# Patient Record
Sex: Female | Born: 1995 | Race: Asian | Hispanic: No | Marital: Married | Smoking: Never smoker
Health system: Southern US, Community
[De-identification: ages and names within clinical notes are randomized; demographics above are authoritative.]

---

## 2019-12-09 ENCOUNTER — Inpatient Hospital Stay (HOSPITAL_COMMUNITY)
Admission: AD | Admit: 2019-12-09 | Discharge: 2019-12-10 | Disposition: A | Discharge status: Home / Self Care | Payer: Medicaid Other | Attending: Obstetrics and Gynecology | Admitting: Obstetrics and Gynecology

## 2019-12-09 ENCOUNTER — Other Ambulatory Visit: Payer: Self-pay

## 2019-12-09 ENCOUNTER — Encounter (HOSPITAL_COMMUNITY): Payer: Self-pay | Admitting: Obstetrics and Gynecology

## 2019-12-09 ENCOUNTER — Inpatient Hospital Stay (HOSPITAL_COMMUNITY): Payer: Medicaid Other

## 2019-12-09 DIAGNOSIS — Z3A10 10 weeks gestation of pregnancy: Secondary | ICD-10-CM | POA: Diagnosis not present

## 2019-12-09 DIAGNOSIS — O021 Missed abortion: Secondary | ICD-10-CM

## 2019-12-09 DIAGNOSIS — R102 Pelvic and perineal pain: Secondary | ICD-10-CM | POA: Diagnosis not present

## 2019-12-09 DIAGNOSIS — O26891 Other specified pregnancy related conditions, first trimester: Secondary | ICD-10-CM | POA: Diagnosis not present

## 2019-12-09 DIAGNOSIS — Z3A01 Less than 8 weeks gestation of pregnancy: Secondary | ICD-10-CM | POA: Diagnosis not present

## 2019-12-09 DIAGNOSIS — O209 Hemorrhage in early pregnancy, unspecified: Secondary | ICD-10-CM | POA: Diagnosis not present

## 2019-12-09 LAB — CBC
HCT: 39.1 % (ref 36.0–46.0)
Hemoglobin: 12.7 g/dL (ref 12.0–15.0)
MCH: 28.1 pg (ref 26.0–34.0)
MCHC: 32.5 g/dL (ref 30.0–36.0)
MCV: 86.5 fL (ref 80.0–100.0)
Platelets: 220 10*3/uL (ref 150–400)
RBC: 4.52 MIL/uL (ref 3.87–5.11)
RDW: 12.1 % (ref 11.5–15.5)
WBC: 5.5 10*3/uL (ref 4.0–10.5)
nRBC: 0 % (ref 0.0–0.2)

## 2019-12-09 LAB — URINALYSIS, ROUTINE W REFLEX MICROSCOPIC
Bacteria, UA: NONE SEEN
Bilirubin Urine: NEGATIVE
Glucose, UA: NEGATIVE mg/dL
Ketones, ur: NEGATIVE mg/dL
Leukocytes,Ua: NEGATIVE
Nitrite: NEGATIVE
Protein, ur: NEGATIVE mg/dL
Specific Gravity, Urine: 1.019 (ref 1.005–1.030)
pH: 5 (ref 5.0–8.0)

## 2019-12-09 LAB — HCG, QUANTITATIVE, PREGNANCY: hCG, Beta Chain, Quant, S: 19818 m[IU]/mL — ABNORMAL HIGH (ref <5)

## 2019-12-09 LAB — ABO/RH: ABO/RH(D): B NEG

## 2019-12-09 MED ORDER — RHO D IMMUNE GLOBULIN 1500 UNIT/2ML IJ SOSY
300.0000 ug | PREFILLED_SYRINGE | Freq: Once | INTRAMUSCULAR | Status: AC
  Administered 2019-12-09: 300 ug via INTRAMUSCULAR
  Filled 2019-12-09: qty 2

## 2019-12-09 MED ORDER — IBUPROFEN 600 MG PO TABS
600.0000 mg | ORAL_TABLET | Freq: Four times a day (QID) | ORAL | 1 refills | Status: DC | PRN
Start: 2019-12-09 — End: 2020-04-28

## 2019-12-09 MED ORDER — ACETAMINOPHEN 325 MG PO TABS: 650.0000 mg | ORAL_TABLET | Freq: Once | ORAL | Status: DC

## 2019-12-09 NOTE — Discharge Instructions (Signed)
Miscarriage A miscarriage is the loss of an unborn baby (fetus) before the 20th week of pregnancy. Follow these instructions at home: Medicines   Take over-the-counter and prescription medicines only as told by your doctor.  If you were prescribed antibiotic medicine, take it as told by your doctor. Do not stop taking the antibiotic even if you start to feel better.  Do not take NSAIDs unless your doctor says that this is safe for you. NSAIDs include aspirin and ibuprofen. These medicines can cause bleeding. Activity  Rest as directed. Ask your doctor what activities are safe for you.  Have someone help you at home during this time. General instructions  Write down how many pads you use each day and how soaked they are.  Watch the amount of tissue or clumps of blood (blood clots) that you pass from your vagina. Save any large amounts of tissue for your doctor.  Do not use tampons, douche, or have sex until your doctor approves.  To help you and your partner with the process of grieving, talk with your doctor or seek counseling.  When you are ready, meet with your doctor to talk about steps you should take for your health. Also, talk with your doctor about steps to take to have a healthy pregnancy in the future.  Keep all follow-up visits as told by your doctor. This is important. Contact a doctor if:  You have a fever or chills.  You have vaginal discharge that smells bad.  You have more bleeding. Get help right away if:  You have very bad cramps or pain in your back or belly.  You pass clumps of blood that are walnut-sized or larger from your vagina.  You pass tissue that is walnut-sized or larger from your vagina.  You soak more than 1 regular pad in an hour.  You get light-headed or weak.  You faint (pass out).  You have feelings of sadness that do not go away, or you have thoughts of hurting yourself. Summary  A miscarriage is the loss of an unborn baby before  the 20th week of pregnancy.  Follow your doctor's instructions for home care. Keep all follow-up appointments.  To help you and your partner with the process of grieving, talk with your doctor or seek counseling. This information is not intended to replace advice given to you by your health care provider. Make sure you discuss any questions you have with your health care provider. Document Revised: 10/11/2018 Document Reviewed: 07/25/2016 Elsevier Patient Education  2020 Elsevier Inc.  

## 2019-12-09 NOTE — MAU Provider Note (Addendum)
Chief Complaint: Vaginal Bleeding   First Provider Initiated Contact with Patient 12/09/19 2254        SUBJECTIVE HPI: Molly Blackwell is a 24 y.o. G2P1001 at [redacted]w[redacted]d by LMP who presents to maternity admissions reporting light pink bleeding and some mild pelvic cramping. . She denies vaginal itching/burning, urinary symptoms, h/a, dizziness, n/v, or fever/chills.    Vaginal Bleeding The patient's primary symptoms include pelvic pain and vaginal bleeding (pink). The patient's pertinent negatives include no genital itching, genital lesions or genital odor. This is a new problem. The current episode started in the past 7 days. The problem occurs intermittently. The problem has been unchanged. The pain is mild. The problem affects both sides. She is pregnant. Associated symptoms include abdominal pain. Pertinent negatives include no back pain, constipation, diarrhea, dysuria, fever, headaches, nausea or vomiting. The vaginal discharge was bloody (pink). The vaginal bleeding is spotting. She has not been passing clots. She has not been passing tissue. Nothing aggravates the symptoms. She has tried nothing for the symptoms.   RN Note: PT has had light pink when since Friday. Thought maybe it was from constipation. Since  Saturday it has been red to dk brown and still light. Has some mild period-like cramping.  Has positive pregnancy test from [place].    No past medical history on file.  Social History   Socioeconomic History  . Marital status: Married    Spouse name: Not on file  . Number of children: Not on file  . Years of education: Not on file  . Highest education level: Not on file  Occupational History  . Not on file  Tobacco Use  . Smoking status: Not on file  Substance and Sexual Activity  . Alcohol use: Not on file  . Drug use: Not on file  . Sexual activity: Not on file  Other Topics Concern  . Not on file  Social History Narrative  . Not on file   Social  Determinants of Health   Financial Resource Strain:   . Difficulty of Paying Living Expenses:   Food Insecurity:   . Worried About Charity fundraiser in the Last Year:   . Arboriculturist in the Last Year:   Transportation Needs:   . Film/video editor (Medical):   Marland Kitchen Lack of Transportation (Non-Medical):   Physical Activity:   . Days of Exercise per Week:   . Minutes of Exercise per Session:   Stress:   . Feeling of Stress :   Social Connections:   . Frequency of Communication with Friends and Family:   . Frequency of Social Gatherings with Friends and Family:   . Attends Religious Services:   . Active Member of Clubs or Organizations:   . Attends Archivist Meetings:   Marland Kitchen Marital Status:   Intimate Partner Violence:   . Fear of Current or Ex-Partner:   . Emotionally Abused:   Marland Kitchen Physically Abused:   . Sexually Abused:    No current facility-administered medications on file prior to encounter.   No current outpatient medications on file prior to encounter.   Not on File  I have reviewed patient's Past Medical Hx, Surgical Hx, Family Hx, Social Hx, medications and allergies.   ROS:  Review of Systems  Constitutional: Negative for fever.  Gastrointestinal: Positive for abdominal pain. Negative for constipation, diarrhea, nausea and vomiting.  Genitourinary: Positive for pelvic pain and vaginal bleeding. Negative for dysuria.  Musculoskeletal: Negative for  back pain.  Neurological: Negative for headaches.   Review of Systems  Other systems negative   Physical Exam  Physical Exam Patient Vitals for the past 24 hrs:  BP Temp Temp src Pulse Resp SpO2 Height Weight  12/09/19 1513 (!) 101/58 99 F (37.2 C) Oral 79 16 100 % 5\' 1"  (1.549 m) 44.7 kg   Constitutional: Well-developed, well-nourished female in no acute distress.  Cardiovascular: normal rate Respiratory: normal effort GI: Abd soft, non-tender. Pos BS x 4 MS: Extremities nontender, no edema,  normal ROM Neurologic: Alert and oriented x 4.  GU: Neg CVAT.  PELVIC EXAM: very light discharge, no red bleeding.  Pelvis nontender, nondistended.  Full pelvic exam deferred since we are getting an tonight.   LAB RESULTS Results for orders placed or performed during the hospital encounter of 12/09/19 (from the past 24 hour(s))  CBC     Status: None   Collection Time: 12/09/19  3:31 PM  Result Value Ref Range   WBC 5.5 4.0 - 10.5 K/uL   RBC 4.52 3.87 - 5.11 MIL/uL   Hemoglobin 12.7 12.0 - 15.0 g/dL   HCT 02/08/20 62.7 - 03.5 %   MCV 86.5 80.0 - 100.0 fL   MCH 28.1 26.0 - 34.0 pg   MCHC 32.5 30.0 - 36.0 g/dL   RDW 00.9 38.1 - 82.9 %   Platelets 220 150 - 400 K/uL   nRBC 0.0 0.0 - 0.2 %  hCG, quantitative, pregnancy     Status: Abnormal   Collection Time: 12/09/19  3:31 PM  Result Value Ref Range   hCG, Beta Chain, Quant, S 19,818 (H) <5 mIU/mL  ABO/Rh     Status: None   Collection Time: 12/09/19  3:31 PM  Result Value Ref Range   ABO/RH(D) B NEG   Rh IG workup (includes ABO/Rh)     Status: None (Preliminary result)   Collection Time: 12/09/19  3:31 PM  Result Value Ref Range   Gestational Age(Wks) 10    ABO/RH(D) B NEG    Antibody Screen      NEG Performed at [facility], [address] [place], [place]    Unit Number [id number]    Blood Component Type RHIG    Unit division 00    Status of Unit ALLOCATED    Transfusion Status OK TO TRANSFUSE   Urinalysis, Routine w reflex microscopic     Status: Abnormal   Collection Time: 12/09/19  6:20 PM  Result Value Ref Range   Color, Urine YELLOW YELLOW   APPearance HAZY (A) CLEAR   Specific Gravity, Urine 1.019 1.005 - 1.030   pH 5.0 5.0 - 8.0   Glucose, UA NEGATIVE NEGATIVE mg/dL   Hgb urine dipstick LARGE (A) NEGATIVE   Bilirubin Urine NEGATIVE NEGATIVE   Ketones, ur NEGATIVE NEGATIVE mg/dL   Protein, ur NEGATIVE NEGATIVE mg/dL   Nitrite NEGATIVE NEGATIVE   Leukocytes,Ua NEGATIVE NEGATIVE   RBC / HPF  21-50 0 - 5 RBC/hpf   WBC, UA 6-10 0 - 5 WBC/hpf   Bacteria, UA NONE SEEN NONE SEEN   Squamous Epithelial / LPF 0-5 0 - 5   Mucus PRESENT     --/--/B NEG, B NEG (06/08 1531)  IMAGING 02-05-1989 OB LESS THAN 14 WEEKS WITH OB TRANSVAGINAL  Result Date: 12/09/2019 CLINICAL DATA:  Vaginal bleeding, quantitative beta HCG 19,818 EXAM: OBSTETRIC <14 WK ULTRASOUND TECHNIQUE: Transabdominal ultrasound was performed for evaluation of the gestation as well as  the maternal uterus and adnexal regions. COMPARISON:  None. FINDINGS: Intrauterine gestational sac: Single Yolk sac:  Visualized. Embryo:  Visualized. Cardiac Activity: Not Visualized on cine loop imaging. M-mode was not performed by the sonographer. CRL:   10.6 mm   7 w 1 d                  [place] EDC: 07/26/2020 Subchorionic hemorrhage:  None visualized. Maternal uterus/adnexae: Right ovary measures 3.3 x 2.6 x 2.6 cm and the left ovary measures 3.2 x 2.5 x 1.9 cm. No pelvic mass or free fluid. IMPRESSION: 1. Single intrauterine pregnancy as above estimated age 80 weeks and 1 day based on crown-rump length measurement. Fetal cardiac activity was not detected on cine loop imaging, compatible with fetal demise. Electronically Signed   By: Sharlet Salina M.D.   On: 12/09/2019 18:58    MAU Management/MDM: Ordered usual first trimester r/o ectopic labs.   Pelvic exam and cultures done Will check baseline Ultrasound to rule out ectopic.  >> 7 w1d Single IUP seen without heartbeat  This bleeding/pain can represent a normal pregnancy with bleeding, spontaneous abortion or even an ectopic which can be life-threatening.  The process as listed above helps to determine which of these is present.  Discussed findings with patient Discussed options of expectant management, cytotec and D&C They desire to proceed expectantly Rhophylac given due to Rh neg status Will set up followup in office  ASSESSMENT 1. Vaginal bleeding in pregnancy, first trimester   2    Cramping in  early pregnancy 3.    Single intrauterine pregnancy at [redacted]w[redacted]d 4.    [redacted]w[redacted]d size fetus noted without cardiac activity (missed abortion)  PLAN Discharge home Discussed expected course of SAB  Warning signs reviewed Rx Ibuprofen for cramping Message sent to office to arrange followup Pt stable at time of discharge. Encouraged to return here or to other Urgent Care/ED if she develops worsening of symptoms, increase in pain, fever, or other concerning symptoms.    Wynelle Bourgeois CNM, MSN Certified Nurse-Midwife 12/09/2019  10:54 PM

## 2019-12-09 NOTE — MAU Note (Addendum)
PT has had light pink when since Friday. Thought maybe it was from constipation. Since  Saturday it has been red to dk brown and still light. Has some mild period-like cramping.  Has positive pregnancy test from [facility].

## 2019-12-10 DIAGNOSIS — O021 Missed abortion: Secondary | ICD-10-CM

## 2019-12-10 DIAGNOSIS — Z3A01 Less than 8 weeks gestation of pregnancy: Secondary | ICD-10-CM

## 2019-12-10 LAB — RH IG WORKUP (INCLUDES ABO/RH)
ABO/RH(D): B NEG
Antibody Screen: NEGATIVE
Gestational Age(Wks): 10
Unit division: 0

## 2019-12-15 ENCOUNTER — Encounter: Payer: Self-pay | Admitting: Certified Nurse Midwife

## 2019-12-15 ENCOUNTER — Ambulatory Visit (INDEPENDENT_AMBULATORY_CARE_PROVIDER_SITE_OTHER): Payer: Medicaid Other | Admitting: Certified Nurse Midwife

## 2019-12-15 ENCOUNTER — Other Ambulatory Visit: Payer: Self-pay

## 2019-12-15 VITALS — BP 97/54 | HR 76 | Wt 100.1 lb

## 2019-12-15 DIAGNOSIS — K5900 Constipation, unspecified: Secondary | ICD-10-CM

## 2019-12-15 DIAGNOSIS — O021 Missed abortion: Secondary | ICD-10-CM | POA: Diagnosis not present

## 2019-12-15 MED ORDER — POLYETHYLENE GLYCOL 3350 17 G PO PACK: 17.0000 g | PACK | Freq: Every day | ORAL | 0 refills | Status: DC

## 2019-12-15 MED ORDER — DOCUSATE SODIUM 100 MG PO CAPS: 100.0000 mg | ORAL_CAPSULE | Freq: Two times a day (BID) | ORAL | 2 refills | Status: DC | PRN

## 2019-12-15 NOTE — Progress Notes (Signed)
History:  Ms. Molly Blackwell is a 24 y.o. G2P1001 who presents to clinic today for missed AB follow up. Patient was diagnosed with missed AB on 12/09/19. She chose expectant management at that time. Patient reports that over the last week she has been having lower abdominal cramping like "period cramps". She denies abdominal pain currently. Patient reports continued vaginal bleeding. Describes the vaginal bleeding as bright red spotting without clots. She reports passing one quarter sized clot earlier this week, but was unsure if any tissue has actually passed.   Patient reports hx of constipation prior to pregnancy. Patient reports last BM was 1 week ago and request medication for help with constipation.   The following portions of the patient's history were reviewed and updated as appropriate: allergies, current medications, family history, past medical history, social history, past surgical history and problem list.  Review of Systems:  Review of Systems  Constitutional: Negative.   Respiratory: Negative.   Cardiovascular: Negative.   Gastrointestinal: Positive for abdominal pain and constipation. Negative for nausea and vomiting.  Genitourinary:       Vaginal bleeding  Neurological: Negative.   Psychiatric/Behavioral: Negative.      Objective:  Physical Exam BP (!) 97/54   Pulse 76   Wt 100 lb 1.6 oz (45.4 kg)   LMP 09/27/2019 (Exact Date)   Breastfeeding Yes   BMI 18.91 kg/m  Physical Exam Vitals and nursing note reviewed.  Cardiovascular:     Rate and Rhythm: Normal rate and regular rhythm.  Pulmonary:     Effort: Pulmonary effort is normal. No respiratory distress.     Breath sounds: Normal breath sounds. No wheezing.  Abdominal:     General: Bowel sounds are increased.     Palpations: Abdomen is soft.     Tenderness: There is abdominal tenderness in the right lower quadrant, periumbilical area and left lower quadrant. There is no right CVA tenderness or left CVA tenderness.   Skin:    General: Skin is warm and dry.  Neurological:     Mental Status: She is alert and oriented to person, place, and time.  Psychiatric:        Mood and Affect: Mood normal.        Behavior: Behavior normal.        Thought Content: Thought content normal.     Assessment & Plan:  1. Missed abortion - Follow up ultrasound ordered to assess for need of medical management or D&C  - Educated and discussed follow up with patient and what occurs if she needs medical management  - Answered patient's questions  - US OB Transvaginal; Future - Beta hCG quant (ref lab)  2. Constipation, unspecified constipation type - polyethylene glycol (MIRALAX / GLYCOLAX) 17 g packet; Take 17 g by mouth daily. Use daily until normal bowel movement. Once bowel movement complete then start taking Colace daily  Dispense: 5 each; Refill: 0 - docusate sodium (COLACE) 100 MG capsule; Take 1 capsule (100 mg total) by mouth 2 (two) times daily as needed.  Dispense: 30 capsule; Refill: 2   Sharyon Cable, CNM 12/15/2019 11:03 AM

## 2019-12-15 NOTE — Progress Notes (Signed)
States she is having intermittent abdominal pain, describes "like period pain." Pt reports last BM one week ago; has not tried anything for constipation. Constipation is an ongoing issue. Pain improves with ibuprofen. Reports vaginal bleeding, changing pad twice daily. States she would like to try for pregnancy again.   Fleet Contras RN  12/15/19

## 2019-12-15 NOTE — Patient Instructions (Signed)
Miscarriage A miscarriage is the loss of an unborn baby (fetus) before the 20th week of pregnancy. Follow these instructions at home: Medicines   Take over-the-counter and prescription medicines only as told by your doctor.  If you were prescribed antibiotic medicine, take it as told by your doctor. Do not stop taking the antibiotic even if you start to feel better.  Do not take NSAIDs unless your doctor says that this is safe for you. NSAIDs include aspirin and ibuprofen. These medicines can cause bleeding. Activity  Rest as directed. Ask your doctor what activities are safe for you.  Have someone help you at home during this time. General instructions  Write down how many pads you use each day and how soaked they are.  Watch the amount of tissue or clumps of blood (blood clots) that you pass from your vagina. Save any large amounts of tissue for your doctor.  Do not use tampons, douche, or have sex until your doctor approves.  To help you and your partner with the process of grieving, talk with your doctor or seek counseling.  When you are ready, meet with your doctor to talk about steps you should take for your health. Also, talk with your doctor about steps to take to have a healthy pregnancy in the future.  Keep all follow-up visits as told by your doctor. This is important. Contact a doctor if:  You have a fever or chills.  You have vaginal discharge that smells bad.  You have more bleeding. Get help right away if:  You have very bad cramps or pain in your back or belly.  You pass clumps of blood that are walnut-sized or larger from your vagina.  You pass tissue that is walnut-sized or larger from your vagina.  You soak more than 1 regular pad in an hour.  You get light-headed or weak.  You faint (pass out).  You have feelings of sadness that do not go away, or you have thoughts of hurting yourself. Summary  A miscarriage is the loss of an unborn baby before  the 20th week of pregnancy.  Follow your doctor's instructions for home care. Keep all follow-up appointments.  To help you and your partner with the process of grieving, talk with your doctor or seek counseling. This information is not intended to replace advice given to you by your health care provider. Make sure you discuss any questions you have with your health care provider. Document Revised: 10/11/2018 Document Reviewed: 07/25/2016 Elsevier Patient Education  2020 Elsevier Inc.  

## 2019-12-16 LAB — BETA HCG QUANT (REF LAB): hCG Quant: 619 m[IU]/mL

## 2019-12-22 ENCOUNTER — Other Ambulatory Visit: Payer: Self-pay

## 2019-12-22 ENCOUNTER — Ambulatory Visit
Admission: RE | Admit: 2019-12-22 | Discharge: 2019-12-22 | Disposition: A | Discharge status: Home / Self Care | Payer: Medicaid Other | Source: Ambulatory Visit | Attending: Certified Nurse Midwife | Admitting: Certified Nurse Midwife

## 2019-12-22 DIAGNOSIS — O021 Missed abortion: Secondary | ICD-10-CM

## 2019-12-23 MED ORDER — MISOPROSTOL 200 MCG PO TABS: ORAL_TABLET | ORAL | 0 refills | Status: DC

## 2019-12-23 NOTE — Addendum Note (Signed)
Addended by: Sharyon Cable on: 12/23/2019 05:00 PM   Modules accepted: Orders

## 2019-12-29 ENCOUNTER — Encounter: Payer: Self-pay | Admitting: Obstetrics and Gynecology

## 2019-12-29 ENCOUNTER — Ambulatory Visit (INDEPENDENT_AMBULATORY_CARE_PROVIDER_SITE_OTHER): Payer: Medicaid Other | Admitting: Obstetrics and Gynecology

## 2019-12-29 ENCOUNTER — Other Ambulatory Visit: Payer: Self-pay

## 2019-12-29 VITALS — BP 94/56 | HR 77 | Ht 61.0 in | Wt 97.0 lb

## 2019-12-29 DIAGNOSIS — O021 Missed abortion: Secondary | ICD-10-CM

## 2019-12-29 NOTE — Progress Notes (Signed)
Subjective:  Molly Blackwell is a 23 y.o. G2P1001 at [redacted]w[redacted]d who presents today for FU BHCG. She was seen on 6/8,  Results from that day show IUP on [place] measuring 7 weeks without cardiac activity. The patient chose expectant management. She followed up in the office on 6/14. On 6/22 she was given cytotec. Quant on 6/14 was 619.  She denies pain or vaginal bleeding.  Objective:  Physical Exam  Nursing note and vitals reviewed. Constitutional: She is oriented to person, place, and time. She appears well-developed and well-nourished. No distress.  HENT:  Head: Normocephalic.  Cardiovascular: Normal rate.  Respiratory: Effort normal.  GI: Soft. There is no tenderness.  Neurological: She is alert and oriented to person, place, and time. Skin: Skin is warm and dry.  Psychiatric: She has a normal mood and affect.   No results found for this or any previous visit (from the past 24 hour(s)).  Assessment/Plan:  SAB follow up Quant today If quant is still positive, will repeat weekly until quant reaches 0.  Duane Lope, NP 12/30/2019 9:23 PM

## 2019-12-30 LAB — BETA HCG QUANT (REF LAB): hCG Quant: 8 m[IU]/mL

## 2019-12-31 ENCOUNTER — Ambulatory Visit: Payer: Medicaid Other | Admitting: Family Medicine

## 2019-12-31 ENCOUNTER — Telehealth: Payer: Self-pay | Admitting: Lactation Services

## 2019-12-31 DIAGNOSIS — O021 Missed abortion: Secondary | ICD-10-CM

## 2019-12-31 NOTE — Telephone Encounter (Signed)
Called patient to inform her of Beta Hcg results and need for non stat beta next week.   Was not able to reach patient. Message left for patient to call the office for results.

## 2019-12-31 NOTE — Telephone Encounter (Signed)
-----   Message from Duane Lope, NP sent at 12/31/2019  7:20 AM EDT ----- Please call Ms. Tsukamoto and let her know she will need a Quant next week. Non stat. She does not need to see a provider.   Thank you,  Victorino Dike

## 2020-01-01 ENCOUNTER — Ambulatory Visit: Payer: Medicaid Other | Admitting: Obstetrics & Gynecology

## 2020-01-01 NOTE — Addendum Note (Signed)
Addended by: Ed Blalock on: 01/01/2020 12:22 PM   Modules accepted: Orders

## 2020-01-01 NOTE — Telephone Encounter (Signed)
Attempted to call patient again with Hcg results. Patient did not answer.   My Chart message sent.

## 2020-01-27 ENCOUNTER — Other Ambulatory Visit: Payer: Self-pay | Admitting: General Practice

## 2020-01-27 ENCOUNTER — Other Ambulatory Visit: Payer: Medicaid Other

## 2020-01-27 ENCOUNTER — Other Ambulatory Visit: Payer: Self-pay

## 2020-01-27 DIAGNOSIS — O021 Missed abortion: Secondary | ICD-10-CM

## 2020-01-28 LAB — BETA HCG QUANT (REF LAB): hCG Quant: <1 m[IU]/mL

## 2020-04-28 ENCOUNTER — Other Ambulatory Visit: Payer: Self-pay

## 2020-04-28 ENCOUNTER — Emergency Department (HOSPITAL_BASED_OUTPATIENT_CLINIC_OR_DEPARTMENT_OTHER)
Admission: EM | Admit: 2020-04-28 | Discharge: 2020-04-28 | Disposition: A | Discharge status: Home / Self Care | Payer: Medicaid Other | Attending: Emergency Medicine | Admitting: Emergency Medicine

## 2020-04-28 ENCOUNTER — Emergency Department (HOSPITAL_BASED_OUTPATIENT_CLINIC_OR_DEPARTMENT_OTHER): Payer: Medicaid Other

## 2020-04-28 ENCOUNTER — Encounter (HOSPITAL_BASED_OUTPATIENT_CLINIC_OR_DEPARTMENT_OTHER): Payer: Self-pay

## 2020-04-28 DIAGNOSIS — O99512 Diseases of the respiratory system complicating pregnancy, second trimester: Secondary | ICD-10-CM | POA: Insufficient documentation

## 2020-04-28 DIAGNOSIS — J069 Acute upper respiratory infection, unspecified: Secondary | ICD-10-CM | POA: Diagnosis not present

## 2020-04-28 DIAGNOSIS — R000 Tachycardia, unspecified: Secondary | ICD-10-CM | POA: Diagnosis not present

## 2020-04-28 DIAGNOSIS — Z20822 Contact with and (suspected) exposure to covid-19: Secondary | ICD-10-CM | POA: Insufficient documentation

## 2020-04-28 DIAGNOSIS — Z3A15 15 weeks gestation of pregnancy: Secondary | ICD-10-CM | POA: Insufficient documentation

## 2020-04-28 DIAGNOSIS — O26892 Other specified pregnancy related conditions, second trimester: Secondary | ICD-10-CM | POA: Diagnosis present

## 2020-04-28 DIAGNOSIS — R059 Cough, unspecified: Secondary | ICD-10-CM

## 2020-04-28 LAB — RESPIRATORY PANEL BY RT PCR (FLU A&B, COVID)
Influenza A by PCR: NEGATIVE
Influenza B by PCR: NEGATIVE
SARS Coronavirus 2 by RT PCR: NEGATIVE

## 2020-04-28 NOTE — Discharge Instructions (Signed)
Your COVID test and chest xray were both negative today. Your symptoms are likely related to a common cold virus.  I would recommend OTC medications for symptomatic relief including cough suppressant such as Mucinex, cough drops, vicks vaporub, and a humidifier at nighttime to help you sleep.  Follow up with your OBGYN regarding your ED visit today Return to the ED for any worsening symptoms

## 2020-04-28 NOTE — ED Notes (Signed)
SpO2 maintained at 97% after ambulation

## 2020-04-28 NOTE — ED Provider Notes (Signed)
[facility] EMERGENCY DEPARTMENT Provider Note   CSN: [id number] Arrival date & time: 04/28/20  1401     History Chief Complaint  Patient presents with  . Cough    Molly Blackwell is a 24 y.o. female who is currently 15 weeks 4 days pregnant ([id number]) who presents to the ED today with complaint of cough and chest congestion for the past 3 days. She has been taking OTC cough medication and Tylenol without relief prompting her to come to the ED today. She feels short of breath with the coughing spells but not at rest. No chest pain. Pt is not vaccinated against COVID 19. She denies any recent sick contacts. No fevers, chills, abdominal pain, vaginal discharge/bleeding, urinary complaints, headache, leg swelling, or any other associated symptoms.   The history is provided by the patient and medical records.       History reviewed. No pertinent past medical history.  There are no problems to display for this patient.   History reviewed. No pertinent surgical history.   OB History    Gravida  3   Para  1   Term  1   Preterm      AB      Living  1     SAB      TAB      Ectopic      Multiple      Live Births  1           Family History  Problem Relation Age of Onset  . Diabetes Mother   . Diabetes Father     Social History   Tobacco Use  . Smoking status: Never Smoker  . Smokeless tobacco: Never Used  Vaping Use  . Vaping Use: Never used  Substance Use Topics  . Alcohol use: Never  . Drug use: Never    Home Medications Prior to Admission medications   Medication Sig Start Date End Date Taking? Authorizing Provider  Prenatal Vit-Fe Fumarate-FA (M-NATAL PLUS) 27-1 MG TABS Take 1 tablet by mouth daily. 04/26/20  Yes [provider]    Allergies    Patient has no known allergies.  Review of Systems   Review of Systems  Constitutional: Negative for chills and fever.  Respiratory: Positive for cough.        + chest congestion    Cardiovascular: Negative for chest pain.  Gastrointestinal: Negative for abdominal pain.  Genitourinary: Negative for vaginal bleeding and vaginal discharge.  All other systems reviewed and are negative.   Physical Exam Updated Vital Signs BP 113/71 (BP Location: Left Arm)   Pulse (!) 111   Temp 98.4 F (36.9 C) (Oral)   Resp 20   Ht 5\' 2"  (1.575 m)   Wt 45.8 kg   LMP 09/27/2019 (Exact Date)   SpO2 93%   BMI 18.47 kg/m   Physical Exam Vitals and nursing note reviewed.  Constitutional:      Appearance: She is not ill-appearing or diaphoretic.  HENT:     Head: Normocephalic and atraumatic.  Eyes:     Conjunctiva/sclera: Conjunctivae normal.  Cardiovascular:     Rate and Rhythm: Regular rhythm. Tachycardia present.  Pulmonary:     Breath sounds: Transmitted upper airway sounds present. Examination of the right-lower field reveals rales. Rales present.     Comments: Able to speak in short sentences between coughing spells. Satting 93% on RA at rest.  Abdominal:     Palpations: Abdomen is soft.  Tenderness: There is no abdominal tenderness.  Musculoskeletal:     Cervical back: Neck supple.  Skin:    General: Skin is warm and dry.  Neurological:     Mental Status: She is alert.     ED Results / Procedures / Treatments   Labs (all labs ordered are listed, but only abnormal results are displayed) Labs Reviewed  RESPIRATORY PANEL BY RT PCR (FLU A&B, COVID)    EKG None  Radiology DG Chest Port 1 View  Result Date: 04/28/2020 CLINICAL DATA:  Fifteen weeks pregnant, flu like symptoms, cough EXAM: PORTABLE CHEST 1 VIEW COMPARISON:  None. FINDINGS: The heart size and mediastinal contours are within normal limits. Both lungs are clear. The visualized skeletal structures are unremarkable. IMPRESSION: No active disease. Electronically Signed   By: Judie Petit.  Shick M.D.   On: 04/28/2020 15:20    Procedures Procedures (including critical care time)  Medications Ordered in  ED Medications - No data to display  ED Course  I have reviewed the triage vital signs and the nursing notes.  Pertinent labs & imaging results that were available during my care of the patient were reviewed by me and considered in my medical decision making (see chart for details).  Clinical Course as of Apr 28 1532  Wed Apr 28, 2020  1518 SARS Coronavirus 2 by RT PCR: NEGATIVE [MV]    Clinical Course User Index [MV] Tanda Rockers, PA-C   MDM Rules/Calculators/A&P                          24 year old female presenting to the ED today with complaint of cough and chest congestion x 3 days. She is currently [redacted]w[redacted]d pregnant and is unvaccinated against COVID. On arrival to the ED pt is afebrile and nontachypneic however tachycardic in the low 110s. On exam she is actively coughing in the room. She is speaking in short sentences between coughing spells and satting 93% on RA at rest. Will plan to ambulate with pulse ox. With auscultation pt has rales throughout however worse in the right lower lung base. Will plan to check for COVID at this time and obtain a CXR with concern for pneumonia. Given she is 15 weeks she can be easily shielded.   Pt ambulated in the ED and O2 sats remained at 97%.  COVID negative CXR without signs of pneumonia today Will treat for URI with cough. Pt instructed on OTC remedies including mucinex, cough drops, humidifiers and close PCP/OBGYN follow up. She is in agreement with plan and stable for discharge home.   This note was prepared using Dragon voice recognition software and may include unintentional dictation errors due to the inherent limitations of voice recognition software.   Final Clinical Impression(s) / ED Diagnoses Final diagnoses:  Viral URI with cough    Rx / DC Orders ED Discharge Orders    None       Discharge Instructions     Your COVID test and chest xray were both negative today. Your symptoms are likely related to a common cold  virus.  I would recommend OTC medications for symptomatic relief including cough suppressant such as Mucinex, cough drops, vicks vaporub, and a humidifier at nighttime to help you sleep.  Follow up with your OBGYN regarding your ED visit today Return to the ED for any worsening symptoms       Tanda Rockers, PA-C 04/28/20 1533    Long, Arlyss Repress, MD  04/29/20 0710  

## 2020-04-28 NOTE — ED Triage Notes (Addendum)
Pt c/o flu like sx x 2 days-NAD-steady gait-pt reports she is [redacted] weeks pregnant

## 2020-12-27 ENCOUNTER — Other Ambulatory Visit: Payer: Self-pay | Admitting: Obstetrics and Gynecology

## 2020-12-27 DIAGNOSIS — O021 Missed abortion: Secondary | ICD-10-CM

## 2021-02-12 IMAGING — US US OB < 14 WEEKS - US OB TV
1 series · 15 of 28 positions shown · non-contrast
Comparison: None.

CLINICAL DATA: Vaginal bleeding, quantitative beta HCG 19,818

EXAM:
OBSTETRIC <14 WK ULTRASOUND
TECHNIQUE: Transabdominal ultrasound was performed for evaluation of the
gestation as well as the maternal uterus and adnexal regions.

[Series 1: us ob < 14 weeks - us ob tv · 57 acquisitions, 15 frames shown]
[im 1/57]
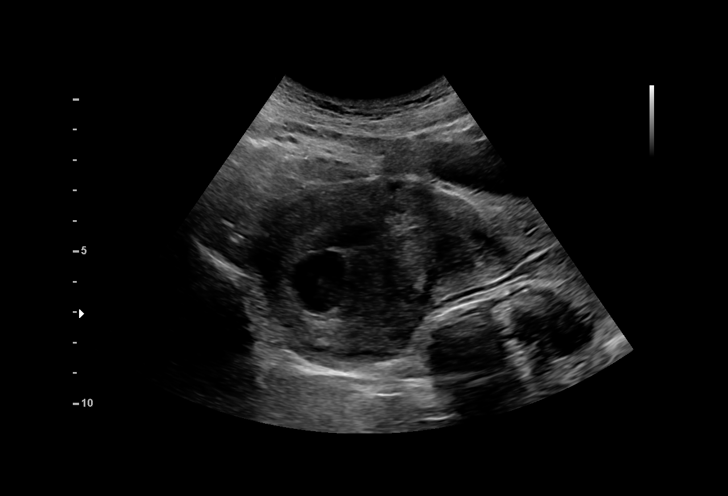
[im 5/57]
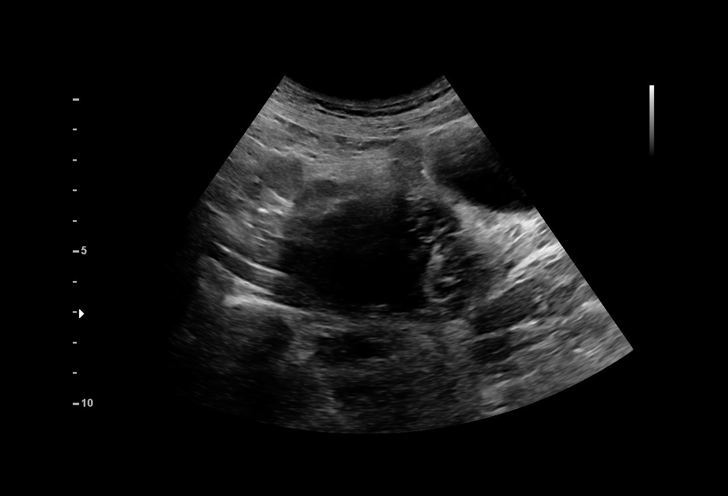
[im 9/57]
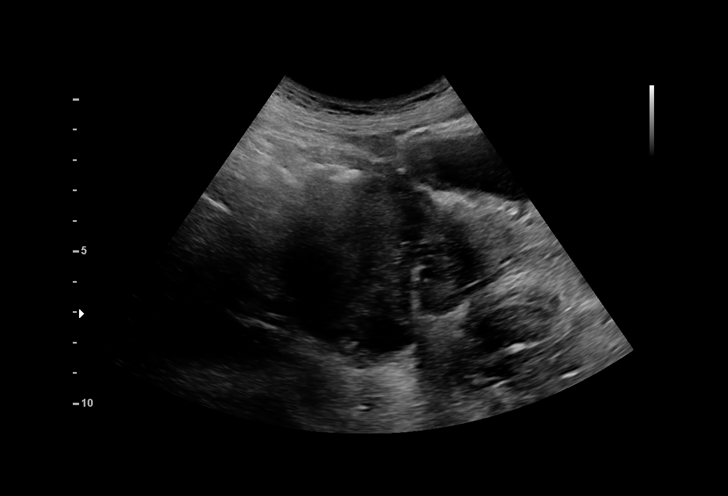
[im 13/57]
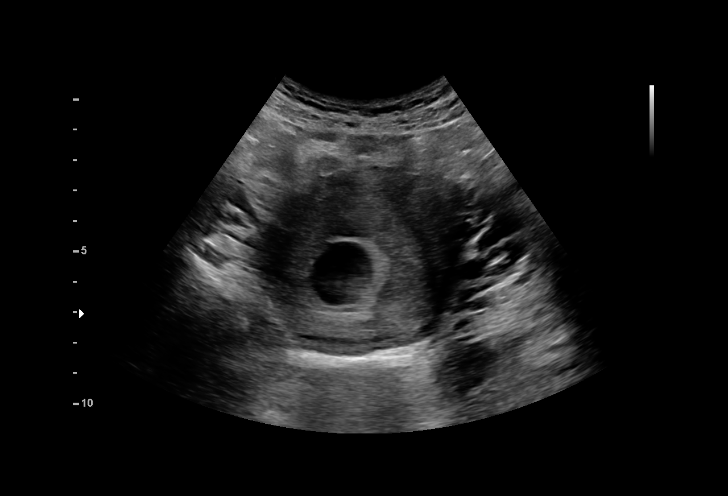
[im 17/57]
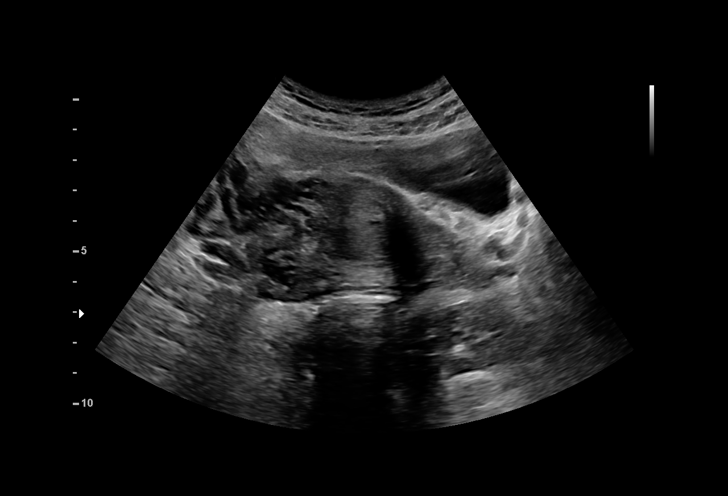
[im 21/57]
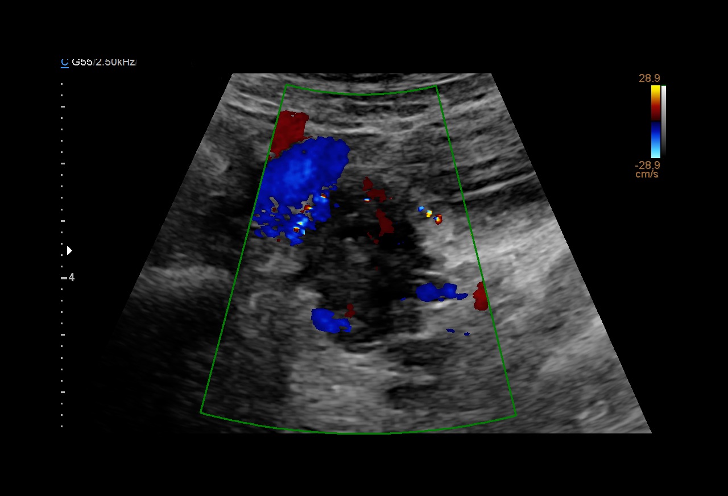
[im 25/57]
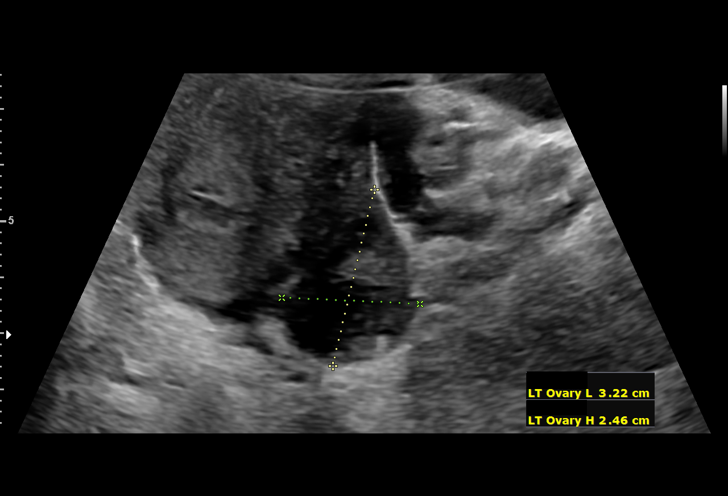
[im 30/57]
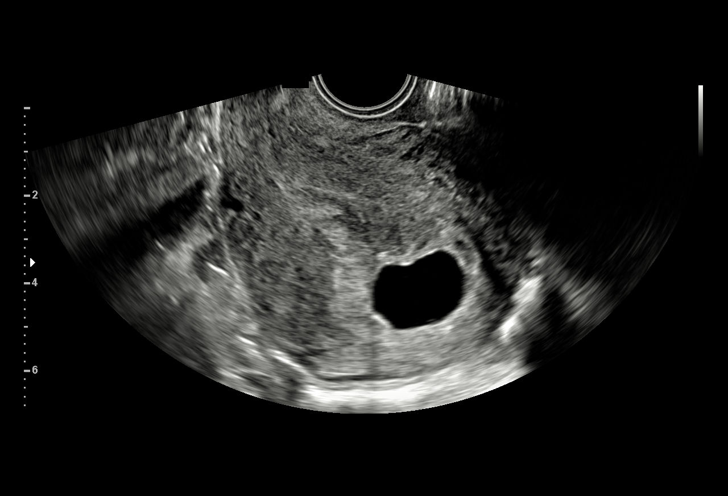
[im 32/57]
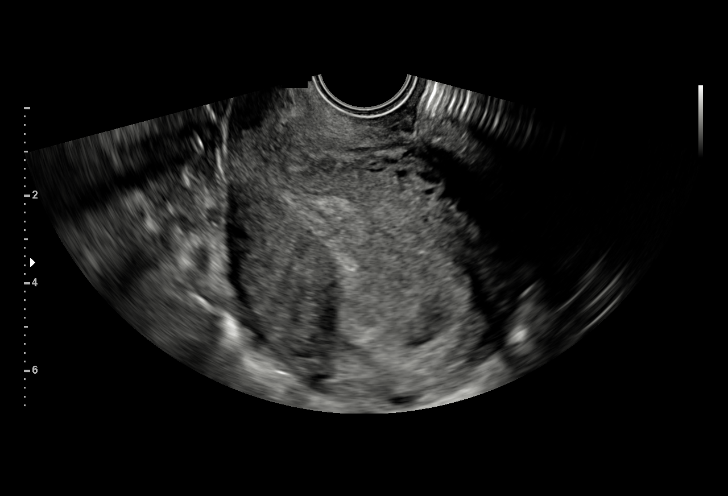
[im 36/57]
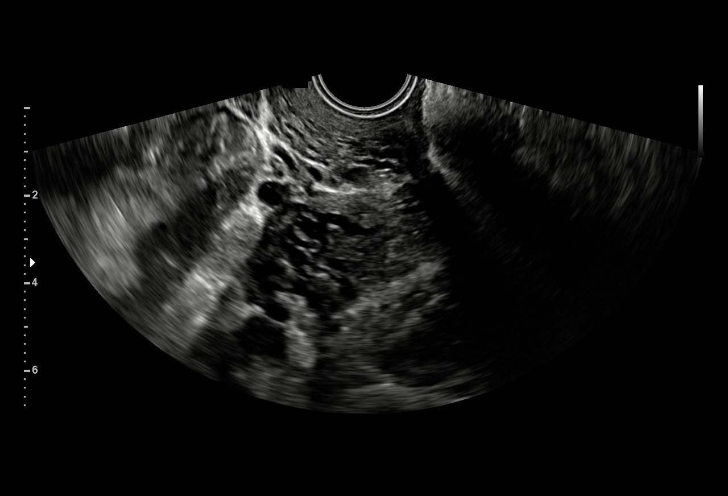
[im 40/57]
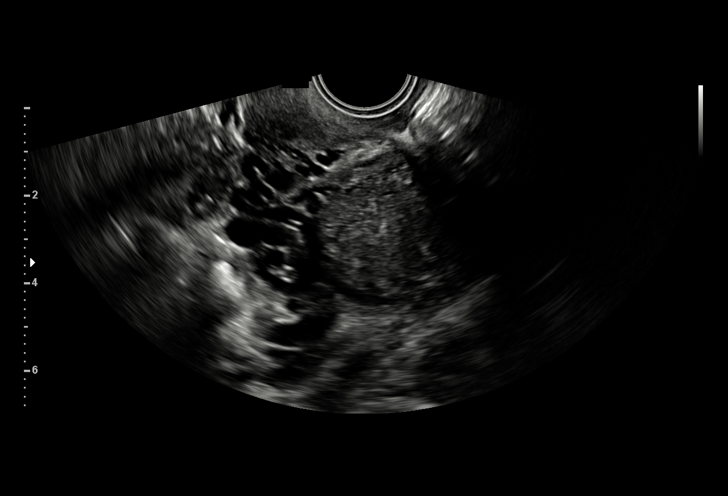
[im 44/57]
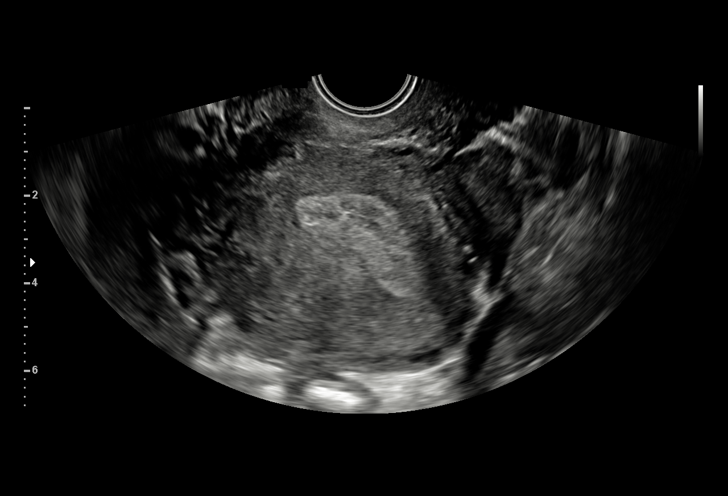
[im 48/57]
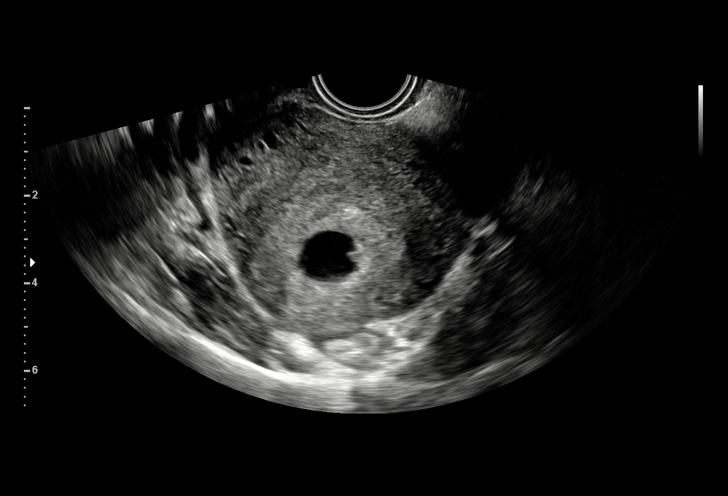
[im 52/57]
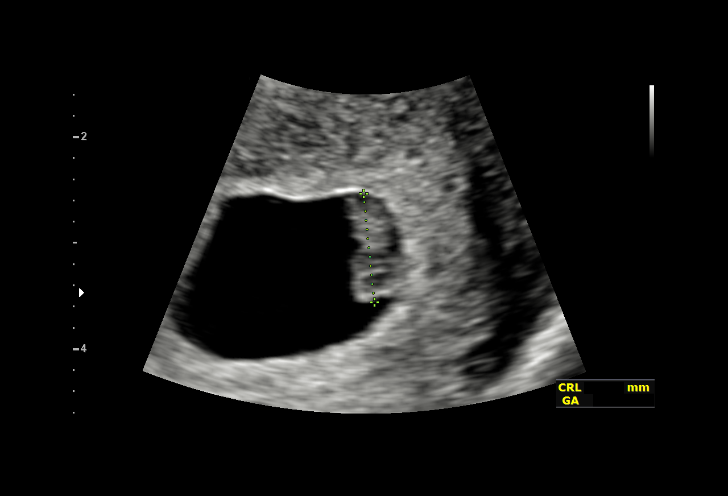
[im 57/57]
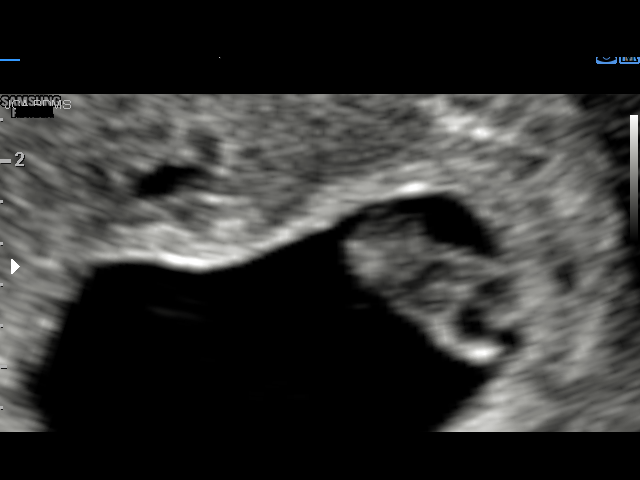

[15 of 28 positions shown; findings below may reference images not displayed]

FINDINGS: Intrauterine gestational sac: Single

Yolk sac:  Visualized.

Embryo:  Visualized.

Cardiac Activity: Not Visualized on cine loop imaging. M-mode was
not performed by the sonographer.

CRL:   10.6 mm   7 w 1 d                  US EDC: 07/26/2020

Subchorionic hemorrhage:  None visualized.

Maternal uterus/adnexae: Right ovary measures 3.3 x 2.6 x 2.6 cm and
the left ovary measures 3.2 x 2.5 x 1.9 cm. No pelvic mass or free
fluid.
IMPRESSION: 1. Single intrauterine pregnancy as above estimated age 7 weeks and
1 day based on crown-rump length measurement. Fetal cardiac activity
was not detected on cine loop imaging, compatible with fetal demise.

## 2021-02-25 IMAGING — US US OB TRANSVAGINAL
1 series · 15 of 28 positions shown · non-contrast
Comparison: 12/09/2019

CLINICAL DATA: Missed abortion

EXAM:
TRANSVAGINAL OB ULTRASOUND
TECHNIQUE: Transvaginal ultrasound was performed for complete evaluation of the
gestation as well as the maternal uterus, adnexal regions, and
pelvic cul-de-sac.

[Series 1: us ob transvaginal · 34 acquisitions, 15 frames shown]
[im 1/34]
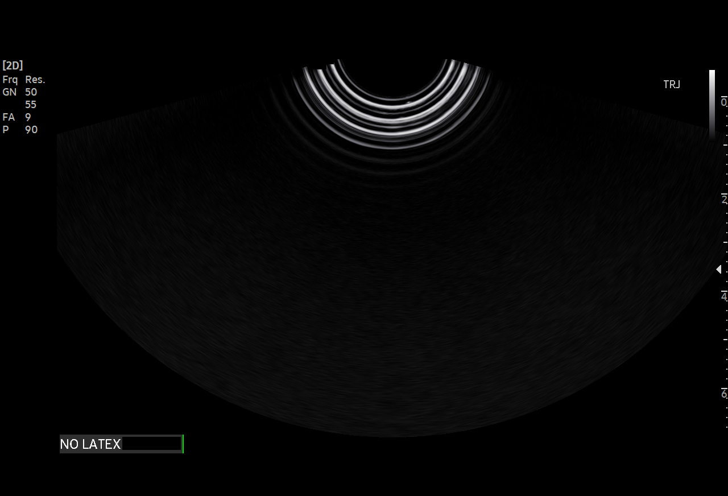
[im 3/34]
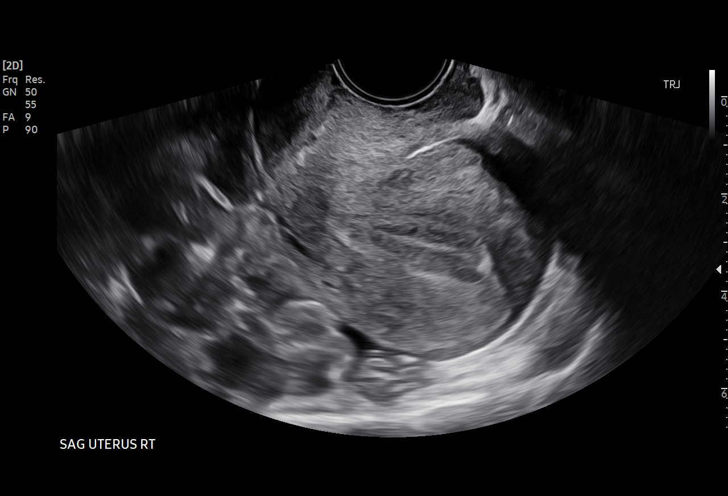
[im 5/34]
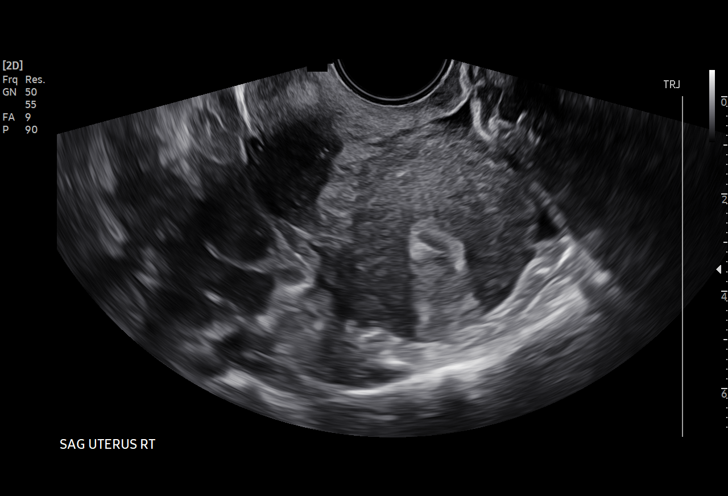
[im 8/34]
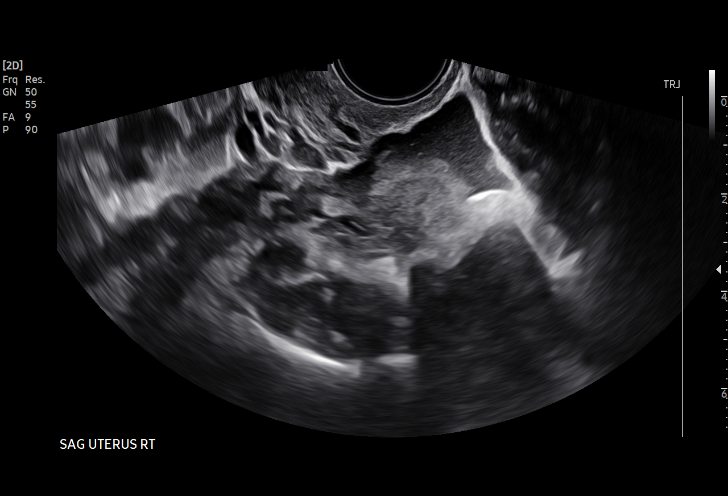
[im 10/34]
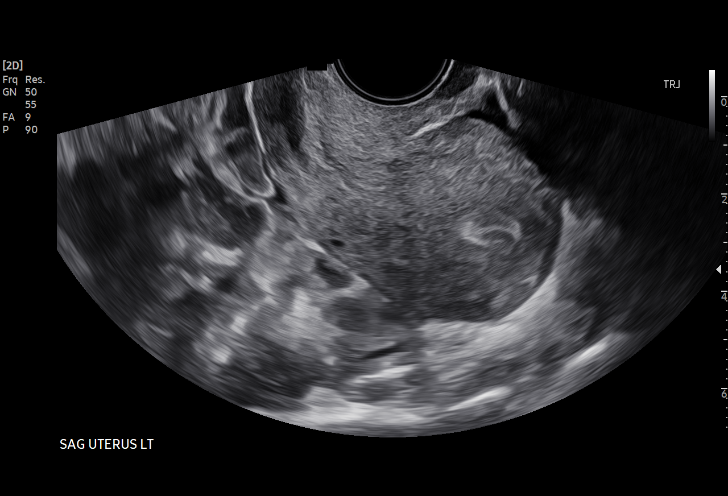
[im 13/34]
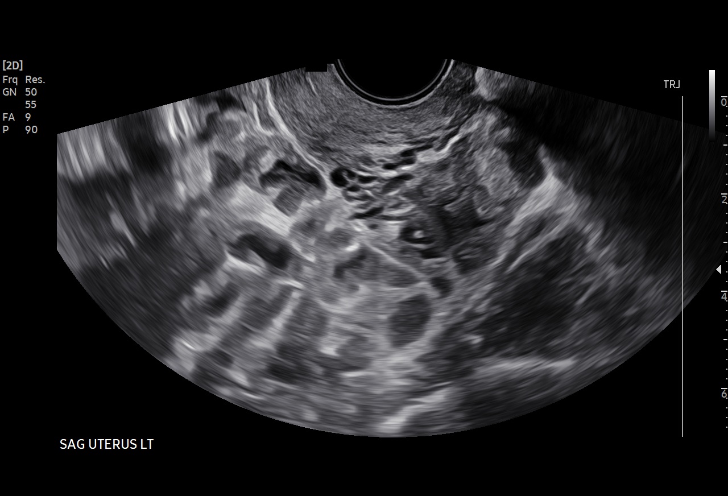
[im 15/34]
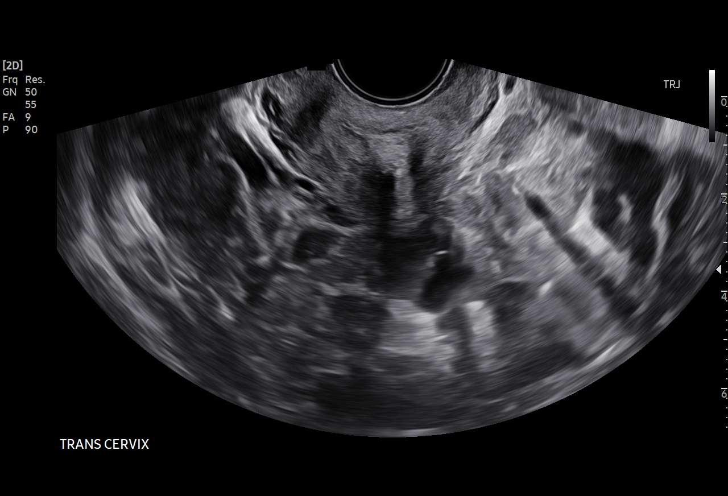
[im 18/34]
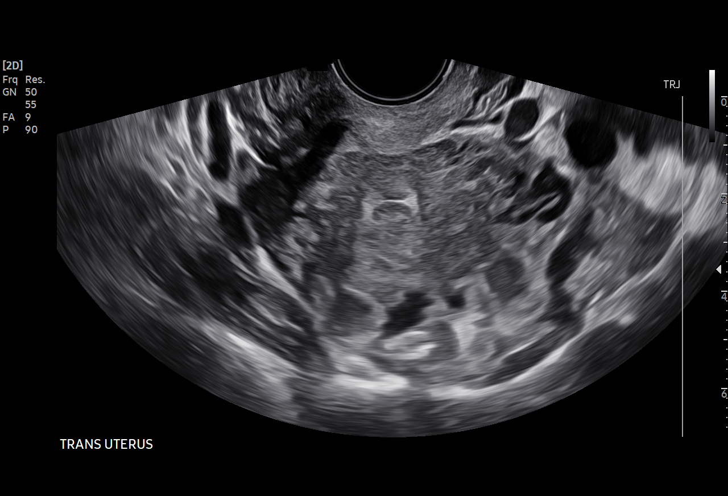
[im 19/34]
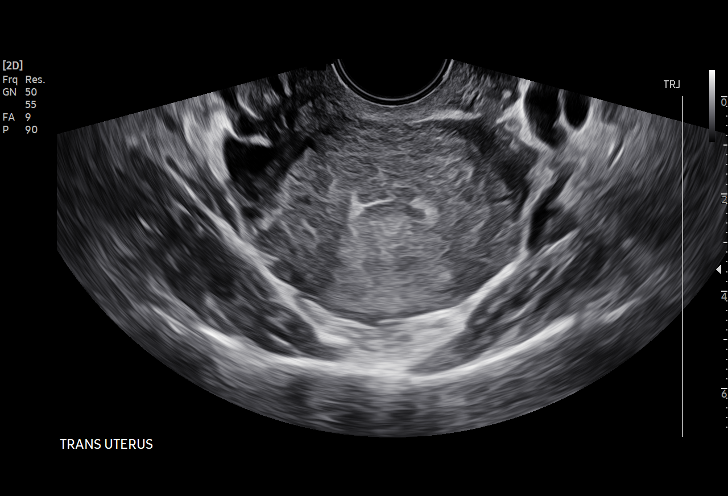
[im 21/34]
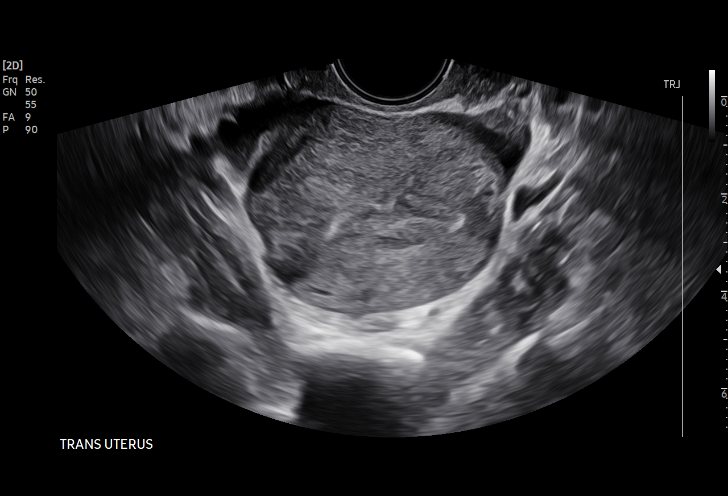
[im 24/34]
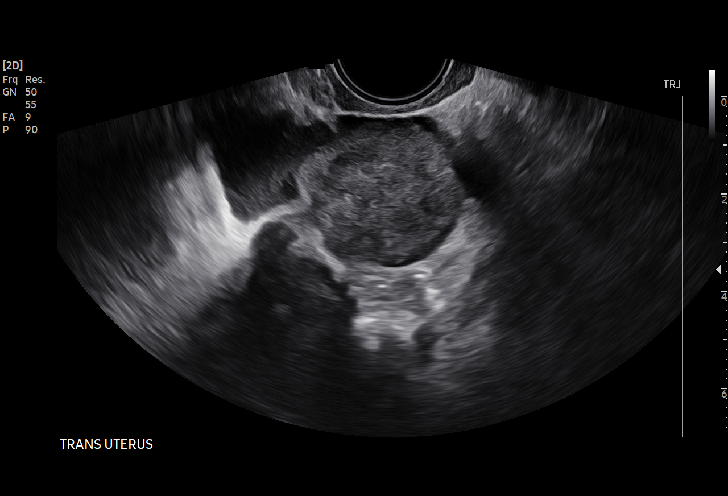
[im 26/34]
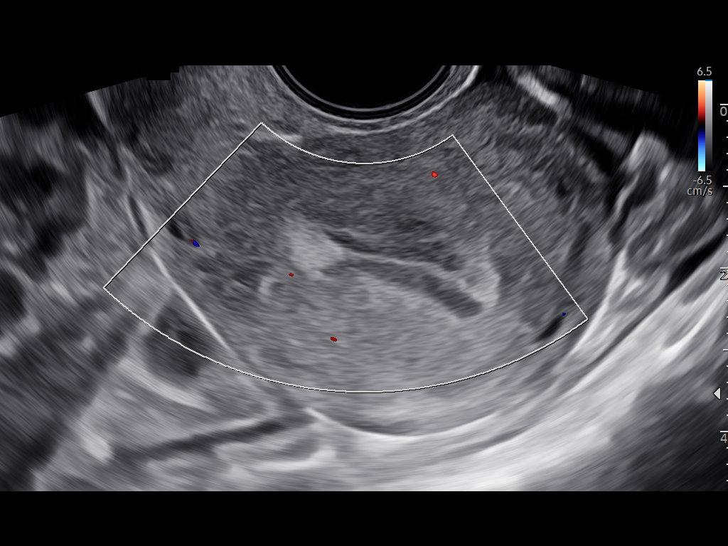
[im 29/34]
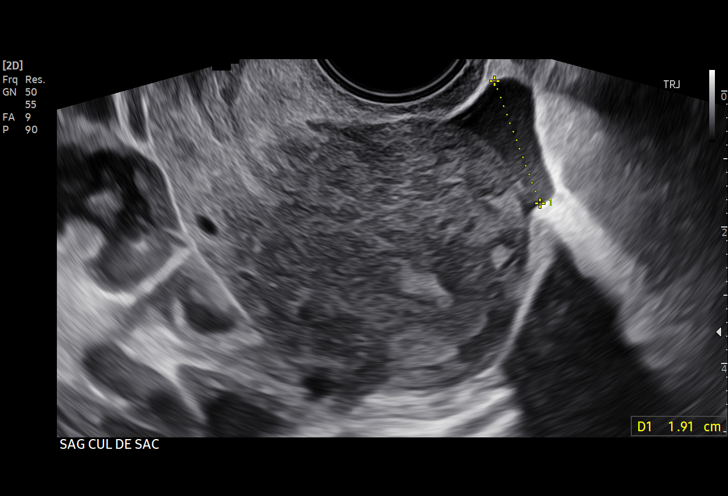
[im 31/34]
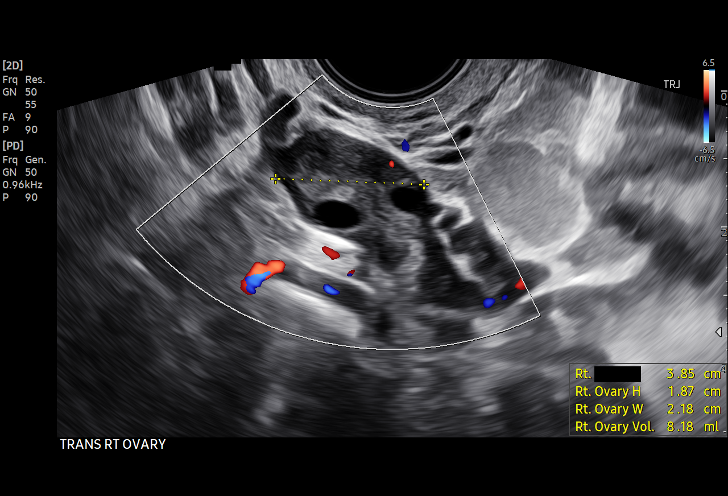
[im 34/34]
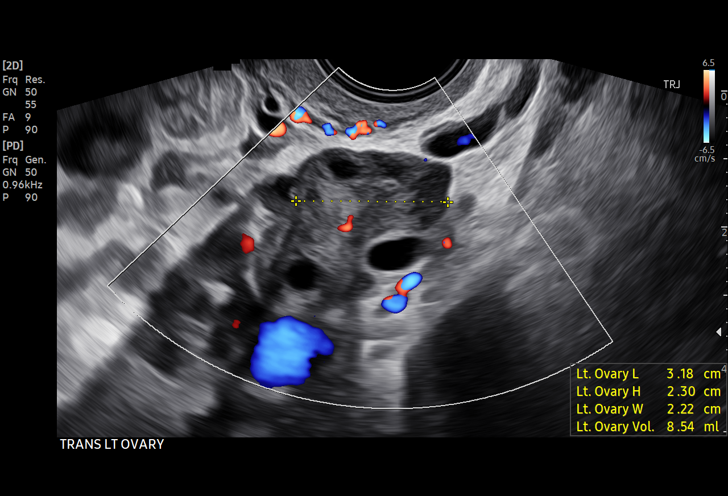

[15 of 28 positions shown; findings below may reference images not displayed]

FINDINGS: Intrauterine gestational sac: None

Yolk sac:  Not Visualized.

Embryo:  Not Visualized.

Maternal uterus/adnexae: Endometrial stripe thickness of 9 mm. The
ovaries are within normal limits. Right ovary measures 3.9 x 1.9 x
2.2 cm with a volume of 8.2 mL. Left ovary measures 3.2 x 2.3 x
cm with a volume of 8.5 mL. Small free fluid in the pelvis with
internal echoes.
IMPRESSION: 1. The previously noted gestational contents within the uterus no
longer visualized. Endometrial stripe thickness of 9 mm, without
increased vascularity or focal mass to suggest retained products.
2. Small volume of slightly complex fluid in the pelvis.

## 2021-07-03 IMAGING — DX DG CHEST 1V PORT
1 series · 1 of 1 positions shown · non-contrast
Comparison: None.

CLINICAL DATA: Fifteen weeks pregnant, flu like symptoms, cough

EXAM:
PORTABLE CHEST 1 VIEW

[chest ap]
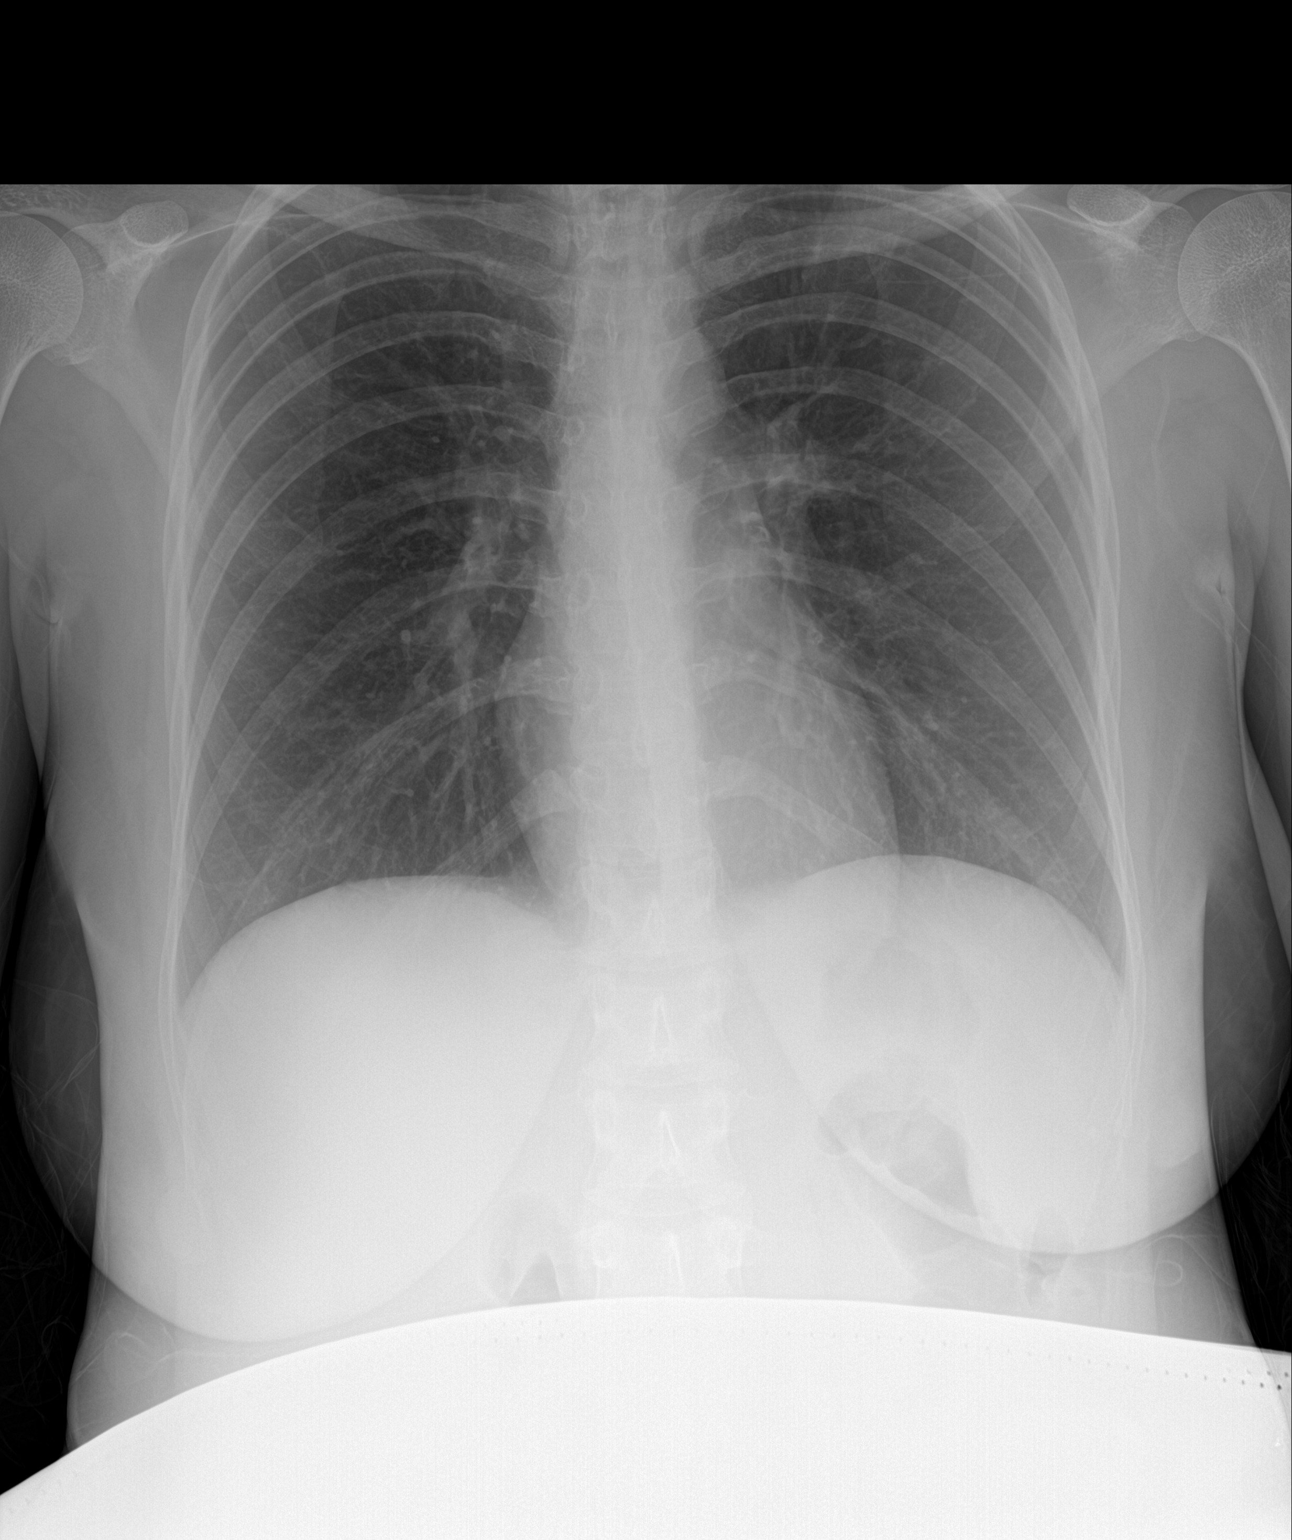

[1 of 1 positions shown; findings below may reference images not displayed]

FINDINGS: The heart size and mediastinal contours are within normal limits.
Both lungs are clear. The visualized skeletal structures are
unremarkable.
IMPRESSION: No active disease.

## 2023-05-14 ENCOUNTER — Other Ambulatory Visit: Payer: Self-pay

## 2023-05-14 DIAGNOSIS — Z3490 Encounter for supervision of normal pregnancy, unspecified, unspecified trimester: Secondary | ICD-10-CM

## 2023-05-21 ENCOUNTER — Ambulatory Visit: Payer: Medicaid Other | Admitting: Obstetrics & Gynecology

## 2023-05-21 ENCOUNTER — Encounter: Payer: Self-pay | Admitting: Obstetrics & Gynecology

## 2023-05-21 VITALS — BP 93/57 | HR 100 | Wt 110.0 lb

## 2023-05-21 DIAGNOSIS — Z3A13 13 weeks gestation of pregnancy: Secondary | ICD-10-CM | POA: Diagnosis not present

## 2023-05-21 DIAGNOSIS — Z6791 Unspecified blood type, Rh negative: Secondary | ICD-10-CM | POA: Diagnosis not present

## 2023-05-21 DIAGNOSIS — O34219 Maternal care for unspecified type scar from previous cesarean delivery: Secondary | ICD-10-CM

## 2023-05-21 DIAGNOSIS — O26899 Other specified pregnancy related conditions, unspecified trimester: Secondary | ICD-10-CM | POA: Insufficient documentation

## 2023-05-21 DIAGNOSIS — O26891 Other specified pregnancy related conditions, first trimester: Secondary | ICD-10-CM | POA: Diagnosis not present

## 2023-05-21 DIAGNOSIS — Z3480 Encounter for supervision of other normal pregnancy, unspecified trimester: Secondary | ICD-10-CM | POA: Insufficient documentation

## 2023-05-21 NOTE — Progress Notes (Signed)
   PRENATAL VISIT NOTE  Subjective:  Molly Blackwell is a 27 y.o. [id number] at [redacted]w[redacted]d being seen today for possible transfer of prenatal care. She has already established care at [facility], already had initial and first follow up visit there and her prenatal labs etc.  She is here today with her husband.   She is currently monitored for the following issues for this low-risk pregnancy and has Supervision of other normal pregnancy, antepartum; Previous cesarean delivery, antepartum; and Rh negative state in antepartum period on their problem list.  Patient reports no complaints.  Contractions: Not present. Vag. Bleeding: None.  Movement: Absent. Denies leaking of fluid.  Wants to know about being guaranteed to have a female provider at delivery.  The following portions of the patient's history were reviewed and updated as appropriate: allergies, current medications, past family history, past medical history, past social history, past surgical history and problem list.   Objective:   Vitals:   05/21/23 0837  BP: (!) 93/57  Pulse: 100  Weight: 110 lb (49.9 kg)    Fetal Status: Fetal Heart Rate (bpm): 145   Movement: Absent     General:  Alert, oriented and cooperative. Patient is in no acute distress.  Skin: Skin is warm and dry. No rash noted.   Cardiovascular: Normal heart rate noted  Respiratory: Normal respiratory effort, no problems with respiration noted  Abdomen: Soft, gravid, appropriate for gestational age.  Pain/Pressure: Absent     Pelvic: Cervical exam deferred        Extremities: Normal range of motion.  Edema: None  Mental Status: Normal mood and affect. Normal behavior. Normal judgment and thought content.   Assessment and Plan:  Pregnancy: [id number] at 104w4d 1. Previous cesarean delivery, antepartum 2. Rh negative state in antepartum period 3. [redacted] weeks gestation of pregnancy 4. Supervision of other normal pregnancy, antepartum The nature of  [facility]Faculty  Practice with multiple MDs and other Advanced Practice Providers was explained to patient; also emphasized that residents, students are part of our team. Discussed with patient that this is a teaching facility and that during their pregnancy there may be female physicians and other healthcare providers involved in their care. This includes, but is not limited to, prenatal visits and ultrasound examinations, as well as, the labor and delivery process and postpartum care. Also discussed with patient that they do have the right to transfer their care to another practice in the event that they do not agree or wish to see female providers under any situation. Informed patient that we can make every attempt to have a female provider care for them, though this cannot be guaranteed, such as in an emergent situation. Also, reminded patient that when they are scheduling their appointments, to request a female provider each time and we will try to accommodate their request. But again emphasized that we do have multiple female providers, and this cannot be guaranteed. Patient said she will remain with her current OB providers at [facility] for further care. She was told to let us know if there was anything we can do to help her in the future.   I spent 30 minutes dedicated to the care of this patient including pre-visit review of records, face to face time with the patient discussing her conditions and treatments, post visit ordering of medications and appropriate tests or procedures, coordinating care and documenting this visit encounter.   No follow-ups on file.  No future appointments.  Jaynie Collins, MD

## 2024-05-09 ENCOUNTER — Encounter: Payer: Self-pay | Admitting: Family Medicine
